# Patient Record
Sex: Male | Born: 1975 | Race: Black or African American | Hispanic: No | Marital: Single | Smoking: Never smoker
Health system: Southern US, Community
[De-identification: ages and names within clinical notes are randomized; demographics above are authoritative.]

---

## 2020-11-27 ENCOUNTER — Emergency Department: Payer: Self-pay

## 2020-11-27 ENCOUNTER — Other Ambulatory Visit: Payer: Self-pay

## 2020-11-27 ENCOUNTER — Emergency Department
Admission: EM | Admit: 2020-11-27 | Discharge: 2020-11-27 | Disposition: A | Discharge status: Home / Self Care | Payer: Self-pay | Attending: Emergency Medicine | Admitting: Emergency Medicine

## 2020-11-27 ENCOUNTER — Encounter: Payer: Self-pay | Admitting: Emergency Medicine

## 2020-11-27 DIAGNOSIS — R1031 Right lower quadrant pain: Secondary | ICD-10-CM | POA: Insufficient documentation

## 2020-11-27 DIAGNOSIS — R55 Syncope and collapse: Secondary | ICD-10-CM

## 2020-11-27 DIAGNOSIS — W1840XA Slipping, tripping and stumbling without falling, unspecified, initial encounter: Secondary | ICD-10-CM | POA: Insufficient documentation

## 2020-11-27 DIAGNOSIS — R609 Edema, unspecified: Secondary | ICD-10-CM

## 2020-11-27 DIAGNOSIS — N453 Epididymo-orchitis: Secondary | ICD-10-CM | POA: Insufficient documentation

## 2020-11-27 DIAGNOSIS — Z23 Encounter for immunization: Secondary | ICD-10-CM | POA: Insufficient documentation

## 2020-11-27 DIAGNOSIS — S0181XA Laceration without foreign body of other part of head, initial encounter: Secondary | ICD-10-CM | POA: Insufficient documentation

## 2020-11-27 LAB — URINALYSIS, ROUTINE W REFLEX MICROSCOPIC
Bacteria, UA: NONE SEEN
Bilirubin Urine: NEGATIVE
Glucose, UA: NEGATIVE mg/dL
Ketones, ur: NEGATIVE mg/dL
Nitrite: NEGATIVE
Protein, ur: NEGATIVE mg/dL
Specific Gravity, Urine: 1.016 (ref 1.005–1.030)
WBC, UA: >50 WBC/hpf — ABNORMAL HIGH (ref 0–5)
pH: 5 (ref 5.0–8.0)

## 2020-11-27 LAB — COMPREHENSIVE METABOLIC PANEL
ALT: 10 U/L (ref 0–44)
AST: 17 U/L (ref 15–41)
Albumin: 4.2 g/dL (ref 3.5–5.0)
Alkaline Phosphatase: 72 U/L (ref 38–126)
Anion gap: 10 (ref 5–15)
BUN: 19 mg/dL (ref 6–20)
CO2: 26 mmol/L (ref 22–32)
Calcium: 9.2 mg/dL (ref 8.9–10.3)
Chloride: 101 mmol/L (ref 98–111)
Creatinine, Ser: 1.26 mg/dL — ABNORMAL HIGH (ref 0.61–1.24)
GFR, Estimated: >60 mL/min (ref >60)
Glucose, Bld: 97 mg/dL (ref 70–99)
Potassium: 3.4 mmol/L — ABNORMAL LOW (ref 3.5–5.1)
Sodium: 137 mmol/L (ref 135–145)
Total Bilirubin: 0.6 mg/dL (ref 0.3–1.2)
Total Protein: 8.4 g/dL — ABNORMAL HIGH (ref 6.5–8.1)

## 2020-11-27 LAB — LIPASE, BLOOD: Lipase: 38 U/L (ref 11–51)

## 2020-11-27 LAB — CBC
HCT: 42 % (ref 39.0–52.0)
Hemoglobin: 13.5 g/dL (ref 13.0–17.0)
MCH: 30.2 pg (ref 26.0–34.0)
MCHC: 32.1 g/dL (ref 30.0–36.0)
MCV: 94 fL (ref 80.0–100.0)
Platelets: 324 10*3/uL (ref 150–400)
RBC: 4.47 MIL/uL (ref 4.22–5.81)
RDW: 12.2 % (ref 11.5–15.5)
WBC: 18.2 10*3/uL — ABNORMAL HIGH (ref 4.0–10.5)
nRBC: 0 % (ref 0.0–0.2)

## 2020-11-27 LAB — TROPONIN I (HIGH SENSITIVITY): Troponin I (High Sensitivity): 8 ng/L (ref <18)

## 2020-11-27 LAB — CHLAMYDIA/NGC RT PCR (ARMC ONLY)
Chlamydia Tr: NOT DETECTED
N gonorrhoeae: NOT DETECTED

## 2020-11-27 MED ORDER — OXYCODONE HCL 5 MG PO TABS: 5.0000 mg | ORAL_TABLET | Freq: Four times a day (QID) | ORAL | 0 refills | Status: AC | PRN

## 2020-11-27 MED ORDER — LIDOCAINE-EPINEPHRINE (PF) 2 %-1:200000 IJ SOLN
10.0000 mL | Freq: Once | INTRAMUSCULAR | Status: AC
  Administered 2020-11-27: 10 mL
  Filled 2020-11-27: qty 20

## 2020-11-27 MED ORDER — OXYCODONE HCL 5 MG PO TABS
5.0000 mg | ORAL_TABLET | Freq: Once | ORAL | Status: AC
  Administered 2020-11-27: 5 mg via ORAL
  Filled 2020-11-27: qty 1

## 2020-11-27 MED ORDER — SODIUM CHLORIDE 0.9 % IV SOLN
1.0000 g | Freq: Once | INTRAVENOUS | Status: AC
  Administered 2020-11-27: 1 g via INTRAVENOUS
  Filled 2020-11-27: qty 10

## 2020-11-27 MED ORDER — TETANUS-DIPHTH-ACELL PERTUSSIS 5-2.5-18.5 LF-MCG/0.5 IM SUSY
0.5000 mL | PREFILLED_SYRINGE | Freq: Once | INTRAMUSCULAR | Status: AC
  Administered 2020-11-27: 0.5 mL via INTRAMUSCULAR
  Filled 2020-11-27: qty 0.5

## 2020-11-27 MED ORDER — POTASSIUM CHLORIDE CRYS ER 20 MEQ PO TBCR: 40.0000 meq | EXTENDED_RELEASE_TABLET | Freq: Once | ORAL | Status: DC

## 2020-11-27 MED ORDER — LEVOFLOXACIN 500 MG PO TABS: 500.0000 mg | ORAL_TABLET | Freq: Every day | ORAL | 0 refills | Status: AC

## 2020-11-27 MED ORDER — SODIUM CHLORIDE 0.9 % IV BOLUS
1000.0000 mL | Freq: Once | INTRAVENOUS | Status: AC
  Administered 2020-11-27: 1000 mL via INTRAVENOUS

## 2020-11-27 MED ORDER — IOHEXOL 300 MG/ML  SOLN
100.0000 mL | Freq: Once | INTRAMUSCULAR | Status: AC | PRN
  Administered 2020-11-27: 100 mL via INTRAVENOUS
  Filled 2020-11-27: qty 100

## 2020-11-27 NOTE — ED Provider Notes (Signed)
[facility] Emergency Department Provider Note  ____________________________________________   Event Date/Time   First MD Initiated Contact with Patient 11/27/20 1531     (approximate)  I have reviewed the triage vital signs and the nursing notes.   HISTORY  Chief Complaint Laceration and Abdominal Pain    HPI Theodore Mcgee is a 45 y.o. male otherwise healthy who comes in with right lower quadrant pain.  Patient reports not feeling well since Wednesday, 3 days.  He states that he initially had pain in his right lower quadrant, constant but now started to radiate into his right testicle.  Nothing makes it better, nothing makes it worse.  He does report a little bit of swelling in his testicle as well.  Reports was having sex a month ago when its been with the same partner.  Denies any discharge.  He states that symptoms he has some increased urinary frequency.  Also reports that is darker in color than normal.  States that he went to the bathroom last night and he thinks he passed out and woke up with blood above his left eye.  Patient denies any neck pain or difficulties moving his neck.           History reviewed. No pertinent past medical history.  There are no problems to display for this patient.   History reviewed. No pertinent surgical history.  Prior to Admission medications   Not on File    Allergies Patient has no known allergies.  History reviewed. No pertinent family history.  Social History Social History   Tobacco Use   Smoking status: Never   Smokeless tobacco: Never  Substance Use Topics   Alcohol use: Yes   Drug use: Not Currently      Review of Systems Constitutional: No fever/chills, LOC Eyes: No visual changes. ENT: No sore throat. Cardiovascular: Denies chest pain. Respiratory: Denies shortness of breath. Gastrointestinal: Abdominal pain no nausea, no vomiting.  No diarrhea.  No constipation. Genitourinary:  Increased frequency, right testicle pain Musculoskeletal: Negative for back pain. Skin: Negative for rash. Neurological: Negative for headaches, focal weakness or numbness. All other ROS negative ____________________________________________   PHYSICAL EXAM:  VITAL SIGNS: ED Triage Vitals [11/27/20 1451]  Enc Vitals Group     BP 121/82     Pulse Rate 83     Resp 17     Temp 98.1 F (36.7 C)     Temp Source Oral     SpO2 100 %     Weight 150 lb (68 kg)     Height 6' (1.829 m)     Head Circumference      Peak Flow      Pain Score 7     Pain Loc      Pain Edu?      Excl. in GC?     Constitutional: Alert and oriented. Well appearing and in no acute distress. Eyes: Conjunctivae are normal. EOMI. pupils are reactive Head: Small laceration above the left eyebrow. Nose: Tenderness along the nose. Mouth/Throat: Mucous membranes are moist.   Neck: No stridor. Trachea Midline. FROM Cardiovascular: Normal rate, regular rhythm. Grossly normal heart sounds.  Good peripheral circulation. Respiratory: Normal respiratory effort.  No retractions. Lungs CTAB. Gastrointestinal: Tenderness in the right lower quadrant no distention. No abdominal bruits.  Musculoskeletal: No lower extremity tenderness nor edema.  No joint effusions. Neurologic:  Normal speech and language. No gross focal neurologic deficits are appreciated.  Skin:  Skin is  warm, dry and intact. No rash noted. Psychiatric: Mood and affect are normal. Speech and behavior are normal. GU: Testicle swelling noted on the right with some tenderness.  No obvious skin changes  ____________________________________________   LABS (all labs ordered are listed, but only abnormal results are displayed)  Labs Reviewed  COMPREHENSIVE METABOLIC PANEL - Abnormal; Notable for the following components:      Result Value   Potassium 3.4 (*)    Creatinine, Ser 1.26 (*)    Total Protein 8.4 (*)    All other components within normal limits   CBC - Abnormal; Notable for the following components:   WBC 18.2 (*)    All other components within normal limits  CHLAMYDIA/NGC RT PCR ([facility] ONLY)            LIPASE, BLOOD  URINALYSIS, ROUTINE W REFLEX MICROSCOPIC  TROPONIN I (HIGH SENSITIVITY)   ____________________________________________   ED ECG REPORT I, Concha Se, the attending physician, personally viewed and interpreted this ECG.  Normal sinus rate of 80, no ST elevation, T wave inversion in lead III, normal intervals ____________________________________________  RADIOLOGY   Official radiology report(s): CT HEAD WO CONTRAST ( )  Result Date: 11/27/2020 CLINICAL DATA:  Facial trauma EXAM: CT HEAD WITHOUT CONTRAST CT MAXILLOFACIAL WITHOUT CONTRAST TECHNIQUE: Multidetector CT imaging of the head and maxillofacial structures were performed using the standard protocol without intravenous contrast. Multiplanar CT image reconstructions of the maxillofacial structures were also generated. COMPARISON:  None. FINDINGS: CT HEAD FINDINGS Brain: No evidence of acute infarction, hemorrhage, hydrocephalus, extra-axial collection or mass lesion/mass effect. Vascular: No hyperdense vessel identified. Skull: No acute fracture. Other: No mastoid effusions. CT MAXILLOFACIAL FINDINGS Osseous: No fracture or mandibular dislocation. No destructive process. Dental disease including periapical lucencies of multiple maxillary and mandibular teeth. Orbits: Negative. No traumatic or inflammatory finding. Sinuses: Retention cyst within the left sphenoid sinus and left maxillary sinus. Otherwise, clear sinuses without air-fluid level. Soft tissues: Negative. IMPRESSION: 1. No evidence of acute intracranial abnormality. 2. No evidence of acute facial fracture. 3. Dental disease including periapical lucencies of multiple maxillary and mandibular teeth. Electronically Signed   By: Feliberto Harts M.D.   On: 11/27/2020 17:24   CT ABDOMEN PELVIS W  CONTRAST  Result Date: 11/27/2020 CLINICAL DATA:  Right lower quadrant pain, abdominal distension EXAM: CT ABDOMEN AND PELVIS WITH CONTRAST TECHNIQUE: Multidetector CT imaging of the abdomen and pelvis was performed using the standard protocol following bolus administration of intravenous contrast. CONTRAST:  OMNIPAQUE IOHEXOL 300 MG/ML  SOLN COMPARISON:  11/27/2020 FINDINGS: Lower chest: No acute pleural or parenchymal lung disease. Hepatobiliary: No focal liver abnormality is seen. No gallstones, gallbladder wall thickening, or biliary dilatation. Pancreas: Unremarkable. No pancreatic ductal dilatation or surrounding inflammatory changes. Spleen: Normal in size without focal abnormality. Adrenals/Urinary Tract: Adrenal glands are unremarkable. Kidneys are normal, without renal calculi, focal lesion, or hydronephrosis. Bladder is unremarkable. Stomach/Bowel: No bowel obstruction or ileus. Normal retrocecal appendix. No bowel wall thickening or inflammatory change. Vascular/Lymphatic: Aortic atherosclerosis. No enlarged abdominal or pelvic lymph nodes. Reproductive: Hyperenhancing right testicle and epididymis consistent with the epididymal orchitis seen on preceding ultrasound. Large right hydrocele again partially visualized. Left hemiscrotum appears unremarkable. Prostate is unremarkable. Other: No free fluid or free gas.  No abdominal wall hernia. Musculoskeletal: No acute or destructive bony lesions. Reconstructed images demonstrate no additional findings. IMPRESSION: 1. Hyperenhancing right epididymis and right testicle, consistent with the epididymo-orchitis seen on preceding ultrasound. The right-sided hydrocele seen on preceding  ultrasound is partially visualized on this exam. 2. Otherwise no acute intra-abdominal or intrapelvic process. Normal appendix. 3.  Aortic Atherosclerosis (ICD10-I70.0). Electronically Signed   By: Sharlet Salina M.D.   On: 11/27/2020 17:25   US SCROTUM  W/DOPPLER  Result Date: 11/27/2020 CLINICAL DATA:  Testicular swelling x3 days. EXAM: SCROTAL ULTRASOUND DOPPLER ULTRASOUND OF THE TESTICLES TECHNIQUE: Complete ultrasound examination of the testicles, epididymis, and other scrotal structures was performed. Color and spectral Doppler ultrasound were also utilized to evaluate blood flow to the testicles. COMPARISON:  None. FINDINGS: Right testicle Measurements: 3.9 cm x 2.6 cm x 2.7 cm. Microlithiasis is noted without evidence of a testicular mass. Diffusely increased flow is noted on color Doppler evaluation. Left testicle Measurements: 4.0 cm x 2.1 cm x 2.6 cm. No mass or microlithiasis visualized. Right epididymis: The right epididymis measures 1.5 cm x 1.0 cm x 1.9 cm and demonstrates increased flow on color Doppler evaluation. Left epididymis: The left epididymis measures 1.5 cm x 0.7 cm x 1.3 cm. Hydrocele:  Bilateral hydroceles are seen, right greater than left. Varicocele:  A septated right-sided varicocele is noted. Pulsed Doppler interrogation of both testes demonstrates normal low resistance arterial and venous waveforms bilaterally. IMPRESSION: 1. Findings consistent with right-sided epididymo-orchitis. 2. Septated right-sided hydrocele. Electronically Signed   By: Aram Candela M.D.   On: 11/27/2020 17:13   CT Maxillofacial Wo Contrast  Result Date: 11/27/2020 CLINICAL DATA:  Facial trauma EXAM: CT HEAD WITHOUT CONTRAST CT MAXILLOFACIAL WITHOUT CONTRAST TECHNIQUE: Multidetector CT imaging of the head and maxillofacial structures were performed using the standard protocol without intravenous contrast. Multiplanar CT image reconstructions of the maxillofacial structures were also generated. COMPARISON:  None. FINDINGS: CT HEAD FINDINGS Brain: No evidence of acute infarction, hemorrhage, hydrocephalus, extra-axial collection or mass lesion/mass effect. Vascular: No hyperdense vessel identified. Skull: No acute fracture. Other: No mastoid  effusions. CT MAXILLOFACIAL FINDINGS Osseous: No fracture or mandibular dislocation. No destructive process. Dental disease including periapical lucencies of multiple maxillary and mandibular teeth. Orbits: Negative. No traumatic or inflammatory finding. Sinuses: Retention cyst within the left sphenoid sinus and left maxillary sinus. Otherwise, clear sinuses without air-fluid level. Soft tissues: Negative. IMPRESSION: 1. No evidence of acute intracranial abnormality. 2. No evidence of acute facial fracture. 3. Dental disease including periapical lucencies of multiple maxillary and mandibular teeth. Electronically Signed   By: Feliberto Harts M.D.   On: 11/27/2020 17:24    ____________________________________________   PROCEDURES  Procedure(s) performed (including Critical Care):  Procedures   ____________________________________________   INITIAL IMPRESSION / ASSESSMENT AND PLAN / ED COURSE  Theodore Mcgee was evaluated in Emergency Department on 11/27/2020 for the symptoms described in the history of present illness. He was evaluated in the context of the global COVID-19 pandemic, which necessitated consideration that the patient might be at risk for infection with the SARS-CoV-2 virus that causes COVID-19. Institutional protocols and algorithms that pertain to the evaluation of patients at risk for COVID-19 are in a state of rapid change based on information released by regulatory bodies including the CDC and federal and state organizations. These policies and algorithms were followed during the patient's care in the ED.    Patient comes in with abdominal pain, syncope episode possibly secondary to the pain.  Labs ordered to evaluate for any electro abnormalities, AKI.  Patient does have slight bump in his creatinine and she will give some IV fluids.  Will get CT scan to evaluate for appendicitis, abscess, Fournier's gangrene as well as  ultrasound to assess Doppler flow.  We will get urine,  gonorrhea and Chlamydia testing.  For possible syncopal episode will get EKG   CT imaging is negative Some dental disease the patient can follow-up outpatient for Ultrasound however concerning for right-sided epididymoorchitis.  He denies any new sexual partners.  He reports that he has been with 1 person and last had sex a month ago.  He states maybe she cheated on him.  I explained the patient does not mean that this is from an STD but wishes to get testing and treat him as if it could be from an STD or could also be from a UTI or E. coli.  We will give him a dose of ceftriaxone and start him on some levofloxacin.  We discussed Tylenol ibuprofen for pain and using oxycodone for breakthrough pain.  Do not drive or work while on this.  Patient will need sutures removed in 3 to 5 days.  This was repaired by PA.  Please see their note.  G/C are still pending.  For the syncopal episode his EKG is without evidence of arrhythmia and his troponin is negative so unlikely related to a cardiac cause.  More likely secondary to pain/vasovagal.  I discussed the provisional nature of ED diagnosis, the treatment so far, the ongoing plan of care, follow up appointments and return precautions with the patient and any family or support people present. They expressed understanding and agreed with the plan, discharged home.         ____________________________________________   FINAL CLINICAL IMPRESSION(S) / ED DIAGNOSES   Final diagnoses:  Swelling  Orchitis and epididymitis  Facial laceration, initial encounter  Syncope, unspecified syncope type      MEDICATIONS GIVEN DURING THIS VISIT:  Medications  oxyCODONE (Oxy IR/ROXICODONE) immediate release tablet 5 mg (5 mg Oral Given 11/27/20 1557)  sodium chloride 0.9 % bolus 1,000 mL (1,000 mLs Intravenous New Bag/Given 11/27/20 1612)  Tdap (BOOSTRIX) injection 0.5 mL (0.5 mLs Intramuscular Given 11/27/20 1608)  lidocaine-EPINEPHrine (XYLOCAINE W/EPI) 2  %-1:200000 (PF) injection 10 mL (10 mLs Infiltration Given 11/27/20 1710)  iohexol (OMNIPAQUE) 300 MG/ML solution 100 mL (100 mLs Intravenous Contrast Given 11/27/20 1703)  cefTRIAXone (ROCEPHIN) 1 g in sodium chloride 0.9 % 100 mL IVPB (1 g Intravenous New Bag/Given 11/27/20 1749)     ED Discharge Orders          Ordered    levofloxacin (LEVAQUIN) 500 MG tablet  Daily        11/27/20 1754    oxyCODONE (ROXICODONE) 5 MG immediate release tablet  Every 6 hours PRN        11/27/20 1754             Note:  This document was prepared using Dragon voice recognition software and may include unintentional dictation errors.    Concha Se, MD 11/27/20 [phone]

## 2020-11-27 NOTE — ED Triage Notes (Signed)
Pt comes into the ED via POV c/o abd pain that has been intermittent x 3 days.  Pt states it was in the middle of the night when he was rushing to the bathroom for the abd pain, when he lost his footing, slipped and hit his head in the bathroom.  Pt now has small laceration over the left eye.  Pt denies any N/V/D.  Pt explains the stomach pain starts in the right lower side of the abdomen and the pain is shooting pain into the testicle.  Pt denies any known bulge.  Pt denies any problems with urination but he states it is darker than normal.  Pt in NAD at this time with even and unlabored respirations.

## 2020-11-27 NOTE — ED Provider Notes (Signed)
..  Laceration Repair  Date/Time: 11/27/2020 4:40 PM Performed by: Racheal Patches, PA-C Authorized by: Racheal Patches, PA-C   Consent:    Consent obtained:  Verbal   Consent given by:  Patient   Risks discussed:  Infection and pain Universal protocol:    Procedure explained and questions answered to patient or proxy's satisfaction: yes     Immediately prior to procedure, a time out was called: yes     Patient identity confirmed:  Verbally with patient Anesthesia:    Anesthesia method:  Local infiltration   Local anesthetic:  Lidocaine 1% WITH epi Laceration details:    Location:  Face   Face location:  L eyebrow   Length (cm):  1.5 Pre-procedure details:    Preparation:  Patient was prepped and draped in usual sterile fashion Exploration:    Hemostasis achieved with:  Direct pressure   Imaging outcome: foreign body not noted     Wound exploration: entire depth of wound visualized     Wound extent: no foreign bodies/material noted, no nerve damage noted and no underlying fracture noted     Contaminated: no   Treatment:    Area cleansed with:  Saline and povidone-iodine   Amount of cleaning:  Standard   Irrigation solution:  Sterile saline   Irrigation volume:    Irrigation method:  Syringe   Debridement:  None Skin repair:    Repair method:  Sutures   Suture size:  5-0   Suture material:  Nylon   Suture technique:  Simple interrupted   Number of sutures:  3 Approximation:    Approximation:  Close Repair type:    Repair type:  Simple Post-procedure details:    Dressing:  Open (no dressing)   Procedure completion:  Tolerated well, no immediate complications    Racheal Patches, PA-C 11/27/20 1825    Concha Se, MD 11/27/20 1842

## 2020-11-27 NOTE — Discharge Instructions (Addendum)
Take Tylenol 1 g every 8 hours and ibuprofen 400 every 8 hours with food.  Use the oxycodone for breakthrough pain.  Do not drive or work while on this.  Take the antibiotics to help with an infection.  Follow-up in MyChart for your STD testing  IMPRESSION:  1. Findings consistent with right-sided epididymo-orchitis.  2. Septated right-sided hydrocele.     3. Dental disease including periapical lucencies of multiple maxillary and mandibular teeth.  Take oxycodone as prescribed. Do not drink alcohol, drive or participate in any other potentially dangerous activities while taking this medication as it may make you sleepy. Do not take this medication with any other sedating medications, either prescription or over-the-counter. If you were prescribed Percocet or Vicodin, do not take these with acetaminophen (Tylenol) as it is already contained within these medications.  This medication is an opiate (or narcotic) pain medication and can be habit forming. Use it as little as possible to achieve adequate pain control. Do not use or use it with extreme caution if you have a history of opiate abuse or dependence. If you are on a pain contract with your primary care doctor or a pain specialist, be sure to let them know you were prescribed this medication today from the Emergency Department. This medication is intended for your use only - do not give any to anyone else and keep it in a secure place where nobody else, especially children, have access to it.

## 2020-11-29 LAB — URINE CULTURE: Culture: <10000 — AB

## 2022-11-02 IMAGING — CT CT MAXILLOFACIAL W/O CM
4 of 9 series · 16 of 47 positions shown, 19 images · non-contrast
Comparison: None.

CLINICAL DATA: Facial trauma

EXAM:
CT HEAD WITHOUT CONTRAST
CT MAXILLOFACIAL WITHOUT CONTRAST
TECHNIQUE: Multidetector CT imaging of the head and maxillofacial structures
were performed using the standard protocol without intravenous
contrast. Multiplanar CT image reconstructions of the maxillofacial
structures were also generated.

[Series 3: head bone · axial · 0.43mm/px · z∈[+267,+409]mm · 10 of 87 slices shown, 13 images]
[im 8/87  brain]
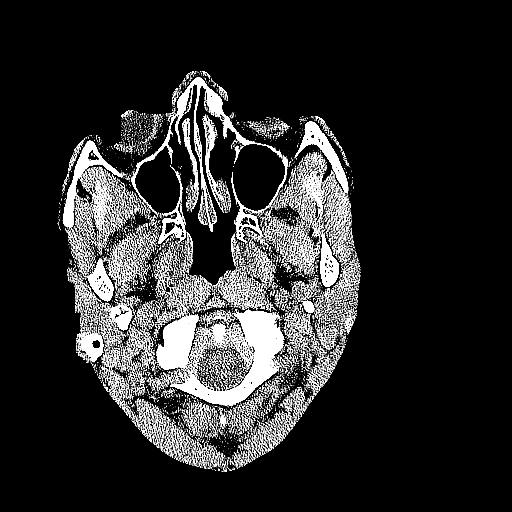
[im 8/87  bone]
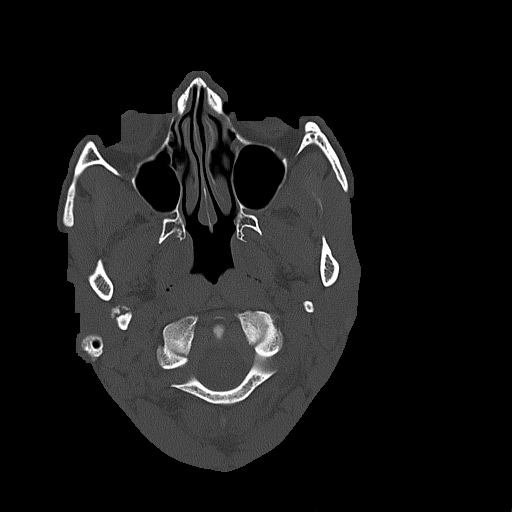
[im 16/87  bone]
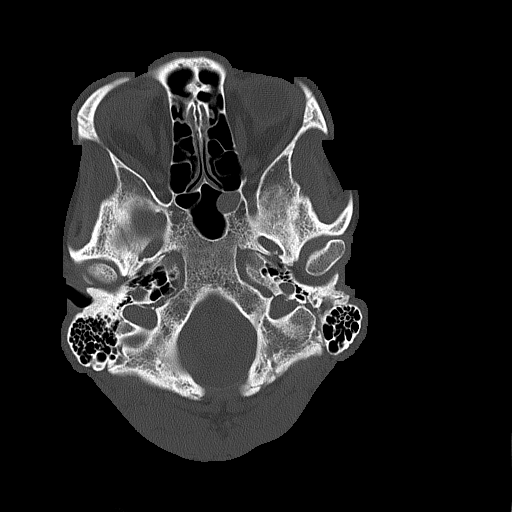
[im 24/87  bone]
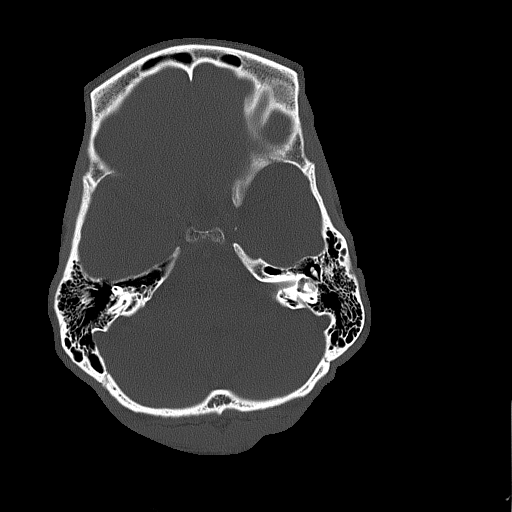
[im 32/87  bone]
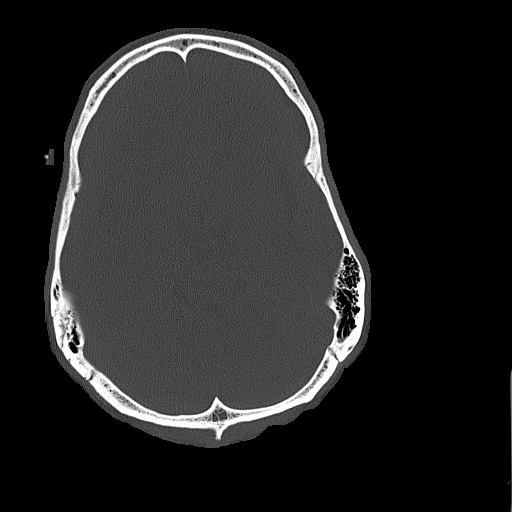
[im 40/87  brain]
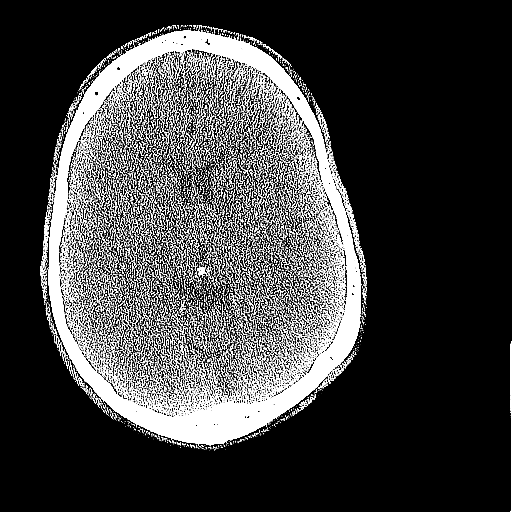
[im 40/87  bone]
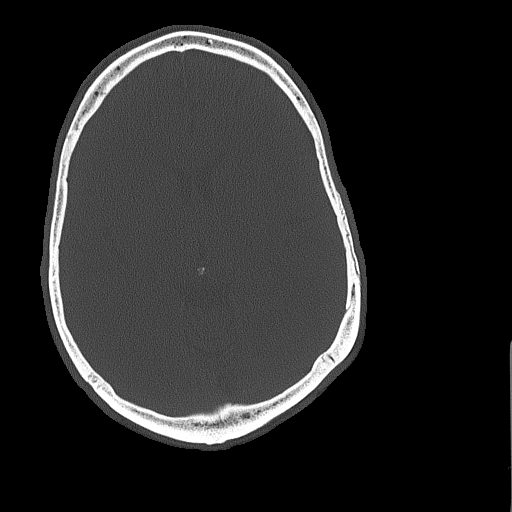
[im 47/87  bone]
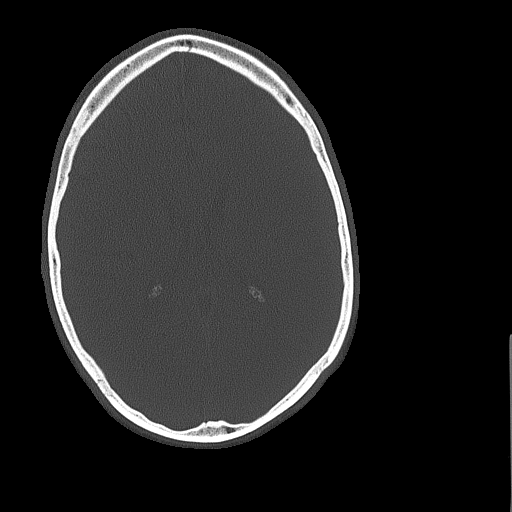
[im 55/87  bone]
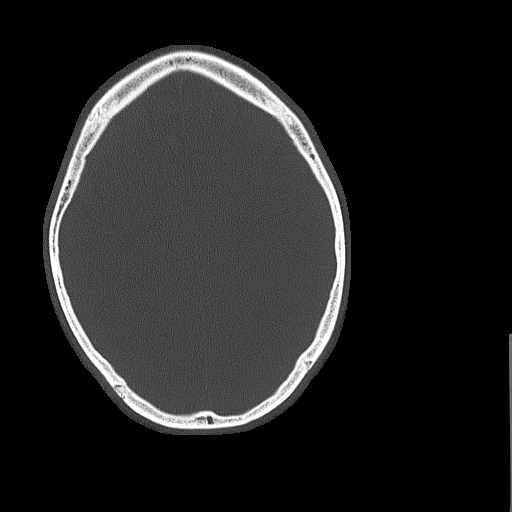
[im 63/87  bone]
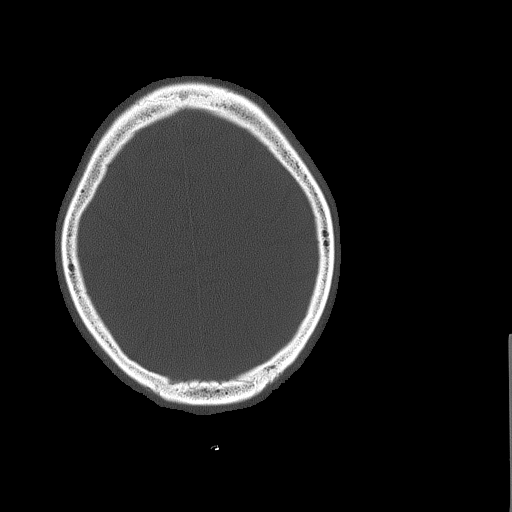
[im 71/87  brain]
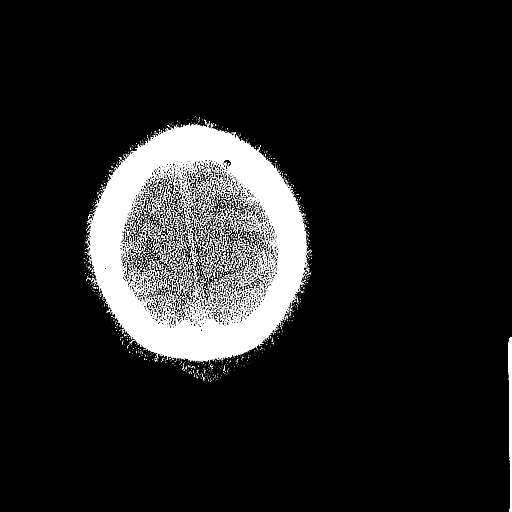
[im 71/87  bone]
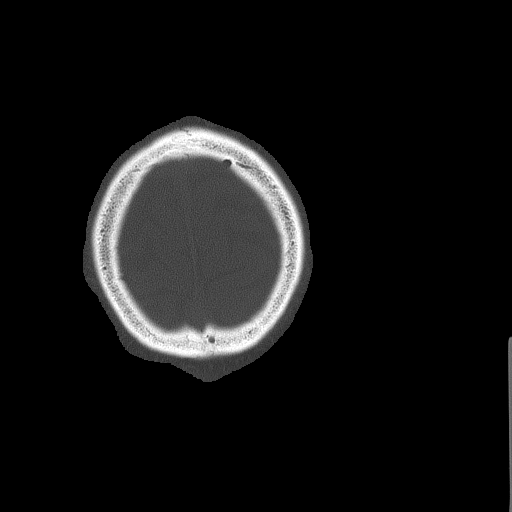
[im 79/87  bone]
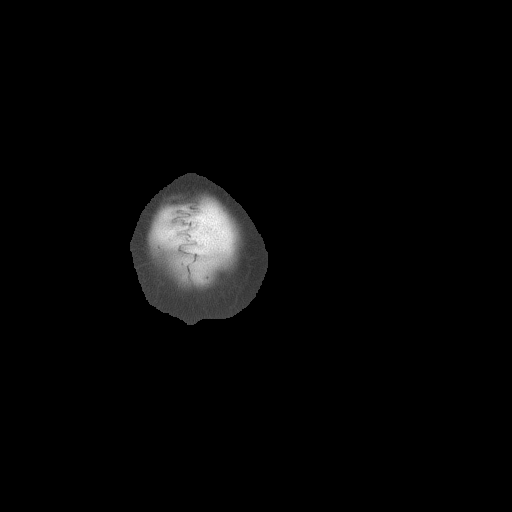

[Series 6: max soft · axial · 0.42mm/px · z∈[+170,+232]mm · 4 of 94 slices shown]
[im 8/94  brain]
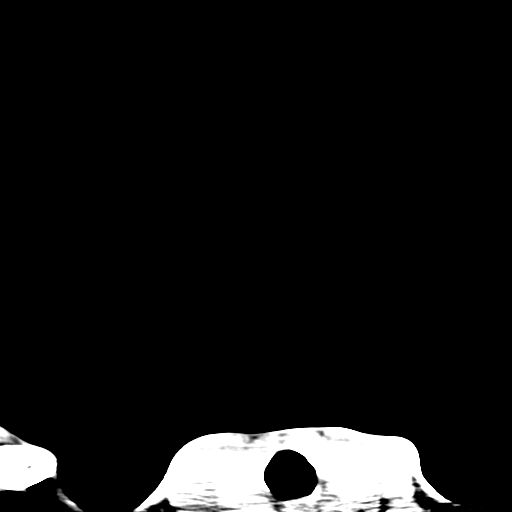
[im 24/94  brain]
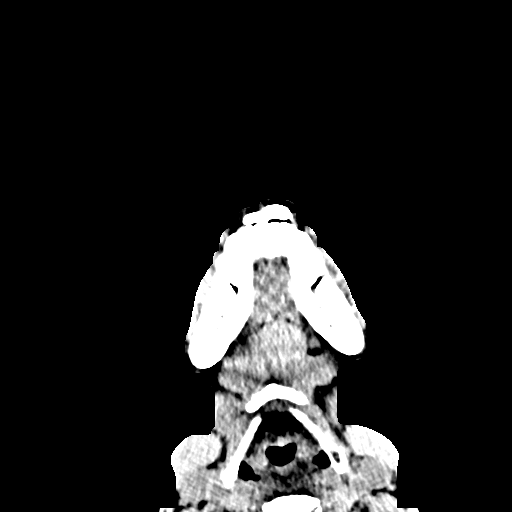
[im 32/94  brain]
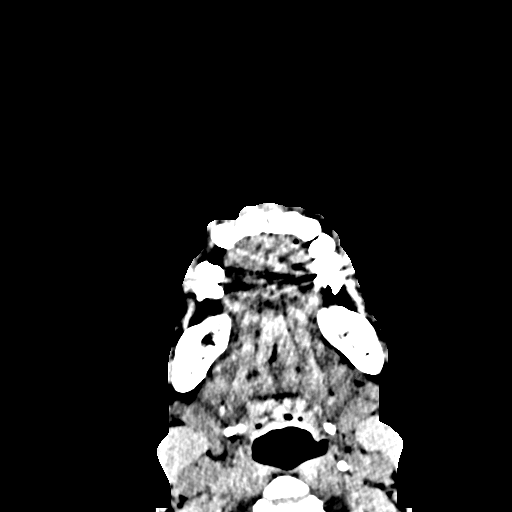
[im 39/94  brain]
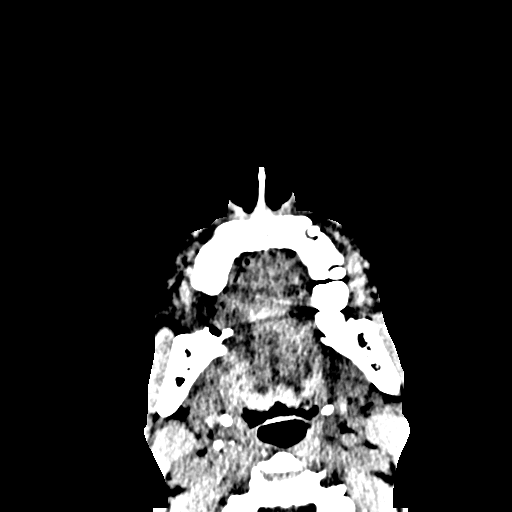

[Series 12: coronal bone · coronal · 0.38mm/px · 1 of 86 slices shown]
[im 43/86  bone]
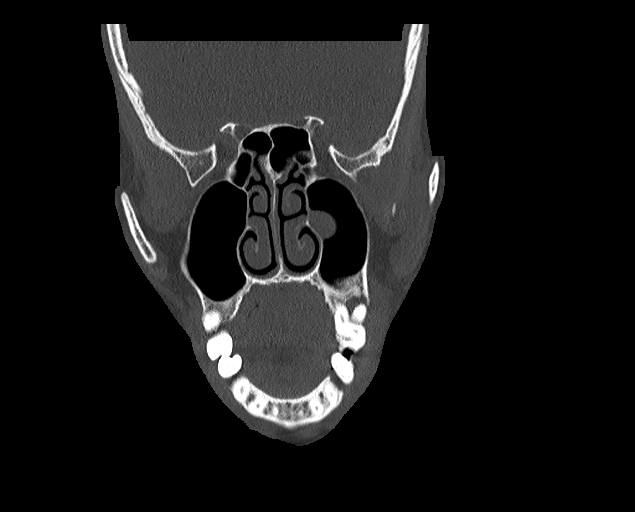

[Series 13: sagittal bone · sagittal · 0.32mm/px · 1 of 79 slices shown]
[im 40/79  bone]
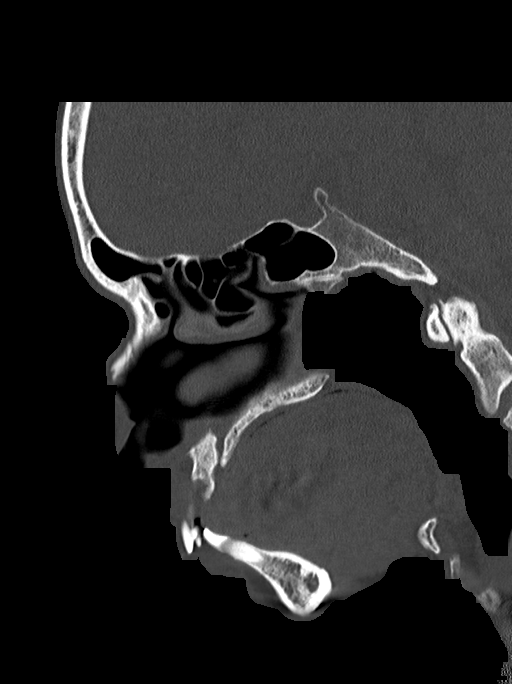

[16 of 47 positions shown; findings below may reference images not displayed]

FINDINGS: CT HEAD FINDINGS

Brain: No evidence of acute infarction, hemorrhage, hydrocephalus,
extra-axial collection or mass lesion/mass effect.

Vascular: No hyperdense vessel identified.

Skull: No acute fracture.

Other: No mastoid effusions.

CT MAXILLOFACIAL FINDINGS

Osseous: No fracture or mandibular dislocation. No destructive
process. Dental disease including periapical lucencies of multiple
maxillary and mandibular teeth.

Orbits: Negative. No traumatic or inflammatory finding.

Sinuses: Retention cyst within the left sphenoid sinus and left
maxillary sinus. Otherwise, clear sinuses without air-fluid level.

Soft tissues: Negative.
IMPRESSION: 1. No evidence of acute intracranial abnormality.
2. No evidence of acute facial fracture.
3. Dental disease including periapical lucencies of multiple
maxillary and mandibular teeth.

## 2023-02-01 ENCOUNTER — Encounter (HOSPITAL_COMMUNITY): Payer: Self-pay

## 2023-02-01 ENCOUNTER — Ambulatory Visit (HOSPITAL_COMMUNITY)
Admission: EM | Admit: 2023-02-01 | Discharge: 2023-02-01 | Disposition: A | Discharge status: Home / Self Care | Payer: 59 | Attending: Emergency Medicine | Admitting: Emergency Medicine

## 2023-02-01 DIAGNOSIS — J101 Influenza due to other identified influenza virus with other respiratory manifestations: Secondary | ICD-10-CM

## 2023-02-01 LAB — POC COVID19/FLU A&B COMBO
Covid Antigen, POC: NEGATIVE
Influenza A Antigen, POC: POSITIVE — AB
Influenza B Antigen, POC: NEGATIVE

## 2023-02-01 MED ORDER — ACETAMINOPHEN 325 MG PO TABS
ORAL_TABLET | ORAL | Status: AC
  Filled 2023-02-01: qty 2

## 2023-02-01 MED ORDER — ACETAMINOPHEN 325 MG PO TABS
650.0000 mg | ORAL_TABLET | Freq: Once | ORAL | Status: AC
  Administered 2023-02-01: 650 mg via ORAL

## 2023-02-01 MED ORDER — OSELTAMIVIR PHOSPHATE 75 MG PO CAPS: 75.0000 mg | ORAL_CAPSULE | Freq: Two times a day (BID) | ORAL | 0 refills | Status: DC

## 2023-02-01 NOTE — Discharge Instructions (Signed)
Start taking Tamiflu twice daily for 5 days.  Otherwise you can alternate between Tylenol and ibuprofen as needed for body aches, headache, and fever.  I recommend Mucinex for cough and congestion.  Return here if symptoms persist.  If you develop severe shortness of breath, chest pain, excessive vomiting, or severe abdominal pain please seek immediate medical treatment in the ER.

## 2023-02-01 NOTE — ED Triage Notes (Signed)
Fever,headache,body ache x 2 days. Patient has taken Nyquil.

## 2023-02-01 NOTE — ED Provider Notes (Signed)
[facility]    CSN: [id number] Arrival date & time: 02/01/23  1745      History   Chief Complaint Chief Complaint  Patient presents with   Headache   Fever   Generalized Body Aches    HPI Theodore Mcgee is a 48 y.o. male.   Patient presents with fever, body aches, headache, cough, and congestion x 2 days.  Patient also endorses vomiting and diarrhea yesterday, denies any today.  Denies abdominal pain, shortness of breath, and chest pain.  Patient reports taking NyQuil with some relief.    Headache Associated symptoms: congestion, cough, diarrhea, fatigue, fever, nausea and vomiting   Associated symptoms: no abdominal pain, no dizziness and no weakness   Fever Associated symptoms: chills, congestion, cough, diarrhea, headaches, nausea, rhinorrhea and vomiting   Associated symptoms: no chest pain     History reviewed. No pertinent past medical history.  There are no active problems to display for this patient.   History reviewed. No pertinent surgical history.     Home Medications    Prior to Admission medications   Medication Sig Start Date End Date Taking? Authorizing Provider  oseltamivir (TAMIFLU) 75 MG capsule Take 1 capsule (75 mg total) by mouth every 12 (twelve) hours. 02/01/23  Yes Letta Kocher, NP    Family History History reviewed. No pertinent family history.  Social History Social History   Tobacco Use   Smoking status: Never   Smokeless tobacco: Never  Substance Use Topics   Alcohol use: Yes   Drug use: Not Currently     Allergies   Patient has no known allergies.   Review of Systems Review of Systems  Constitutional:  Positive for chills, fatigue and fever.  HENT:  Positive for congestion and rhinorrhea.   Respiratory:  Positive for cough. Negative for chest tightness and shortness of breath.   Cardiovascular:  Negative for chest pain.  Gastrointestinal:  Positive for diarrhea, nausea and vomiting. Negative for  abdominal pain.  Neurological:  Positive for headaches. Negative for dizziness and weakness.     Physical Exam Triage Vital Signs ED Triage Vitals [02/01/23 1849]  Encounter Vitals Group     BP 135/83     Systolic BP Percentile      Diastolic BP Percentile      Pulse Rate 83     Resp 16     Temp 100.3 F (37.9 C)     Temp Source Oral     SpO2 95 %     Weight      Height      Head Circumference      Peak Flow      Pain Score      Pain Loc      Pain Education      Exclude from Growth Chart    No data found.  Updated Vital Signs BP 135/83 (BP Location: Left Arm)   Pulse 83   Temp 100.3 F (37.9 C) (Oral)   Resp 16   SpO2 95%   Visual Acuity Right Eye Distance:   Left Eye Distance:   Bilateral Distance:    Right Eye Near:   Left Eye Near:    Bilateral Near:     Physical Exam Vitals and nursing note reviewed.  Constitutional:      General: He is awake. He is not in acute distress.    Appearance: Normal appearance. He is well-developed and well-groomed. He is not ill-appearing.  HENT:  Right Ear: Tympanic membrane, ear canal and external ear normal.     Left Ear: Tympanic membrane, ear canal and external ear normal.     Nose: Congestion and rhinorrhea present.     Mouth/Throat:     Mouth: Mucous membranes are moist.     Pharynx: Posterior oropharyngeal erythema present. No oropharyngeal exudate.  Cardiovascular:     Rate and Rhythm: Normal rate and regular rhythm.  Pulmonary:     Effort: Pulmonary effort is normal.     Breath sounds: Normal breath sounds.  Abdominal:     General: Abdomen is flat.     Palpations: Abdomen is soft.     Tenderness: There is no abdominal tenderness.  Skin:    General: Skin is warm and dry.  Neurological:     Mental Status: He is alert.  Psychiatric:        Behavior: Behavior is cooperative.      UC Treatments / Results  Labs (all labs ordered are listed, but only abnormal results are displayed) Labs Reviewed   POC COVID19/FLU A&B COMBO - Abnormal; Notable for the following components:      Result Value   Influenza A Antigen, POC Positive (*)    All other components within normal limits    EKG   Radiology No results found.  Procedures Procedures (including critical care time)  Medications Ordered in UC Medications  acetaminophen (TYLENOL) tablet 650 mg (650 mg Oral Given 02/01/23 1855)    Initial Impression / Assessment and Plan / UC Course  I have reviewed the triage vital signs and the nursing notes.  Pertinent labs & imaging results that were available during my care of the patient were reviewed by me and considered in my medical decision making (see chart for details).     Patient presented with 2-day history of fever, body aches, headache, cough, and congestion.  Patient also endorses vomiting and diarrhea yesterday, denies any today.  Upon assessment congestion and rhinorrhea are present, mild erythema noted to pharynx.  Lungs clear bilaterally on auscultation.  Tested positive for flu A in clinic.  Given Tylenol in clinic for fever.  Prescribed Tamiflu for influenza A.  Recommended over-the-counter medications.  Discussed return and strict ER precautions. Final Clinical Impressions(s) / UC Diagnoses   Final diagnoses:  Influenza A     Discharge Instructions      Start taking Tamiflu twice daily for 5 days.  Otherwise you can alternate between Tylenol and ibuprofen as needed for body aches, headache, and fever.  I recommend Mucinex for cough and congestion.  Return here if symptoms persist.  If you develop severe shortness of breath, chest pain, excessive vomiting, or severe abdominal pain please seek immediate medical treatment in the ER.    ED Prescriptions     Medication Sig Dispense Auth. Provider   oseltamivir (TAMIFLU) 75 MG capsule Take 1 capsule (75 mg total) by mouth every 12 (twelve) hours. 10 capsule Wynonia Lawman A, NP      PDMP not reviewed this  encounter.   Wynonia Lawman A, NP 02/01/23 [phone]

## 2023-10-11 ENCOUNTER — Other Ambulatory Visit: Payer: Self-pay

## 2023-10-11 ENCOUNTER — Encounter (HOSPITAL_COMMUNITY): Payer: Self-pay | Admitting: Emergency Medicine

## 2023-10-11 ENCOUNTER — Ambulatory Visit (HOSPITAL_COMMUNITY)
Admission: EM | Admit: 2023-10-11 | Discharge: 2023-10-11 | Disposition: A | Discharge status: Home / Self Care | Attending: Family Medicine | Admitting: Family Medicine

## 2023-10-11 DIAGNOSIS — M62830 Muscle spasm of back: Secondary | ICD-10-CM | POA: Diagnosis not present

## 2023-10-11 MED ORDER — CYCLOBENZAPRINE HCL 10 MG PO TABS: ORAL_TABLET | ORAL | 0 refills | Status: AC

## 2023-10-11 MED ORDER — IBUPROFEN 800 MG PO TABS: 800.0000 mg | ORAL_TABLET | Freq: Three times a day (TID) | ORAL | 0 refills | Status: AC

## 2023-10-11 NOTE — Discharge Instructions (Signed)
 HOME CARE INSTRUCTIONS: For many people, back pain returns. Since low back pain is rarely dangerous, it is often a condition that people can learn to manage on their own. Please remain active. It is stressful on the back to sit or stand in one place. Do not sit, drive, or stand in one place for more than 30 minutes at a time. Take short walks on level surfaces as soon as pain allows. Try to increase the length of time you walk each day. Do not stay in bed. Resting more than 1 or 2 days can delay your recovery. Do not avoid exercise or work. Your body is made to move. It is not dangerous to be active, even though your back may hurt. Your back will likely heal faster if you return to being active before your pain is gone. Over-the-counter medicines to reduce pain and inflammation are often the most helpful.  SEEK MEDICAL CARE IF: You have pain that is not relieved with rest or medicine. You have pain that does not improve in 1 week. You have new symptoms. You are generally not feeling well.  SEEK IMMEDIATE MEDICAL CARE IF: You have pain that radiates from your back into your legs. You develop new bowel or bladder control problems. You have unusual weakness or numbness in your arms or legs. You develop nausea or vomiting. You develop abdominal pain. You feel faint.

## 2023-10-11 NOTE — ED Provider Notes (Signed)
 [facility]   [id number] 10/11/23 Arrival Time: 1609  ASSESSMENT & PLAN:  1. Muscle spasm of back    Able to ambulate here and hemodynamically stable. No indication for imaging of back at this time given no trauma and normal neurological exam..  Meds ordered this encounter  Medications   cyclobenzaprine (FLEXERIL) 10 MG tablet    Sig: Take 1 tablet by mouth 3 times daily as needed for muscle spasm. Warning: May cause drowsiness.    Dispense:  21 tablet    Refill:  0   ibuprofen (ADVIL) 800 MG tablet    Sig: Take 1 tablet (800 mg total) by mouth 3 (three) times daily with meals.    Dispense:  21 tablet    Refill:  0   Work/school excuse note: provided. Medication sedation precautions given. Encourage ROM/movement as tolerated.  Recommend:  Follow-up Information     [facility].   Specialty: Urgent Care Why: If worsening or failing to improve as anticipated. Contact information: [address] [place]  [address] [phone]                Reviewed expectations re: course of current medical issues. Questions answered. Outlined signs and symptoms indicating need for more acute intervention. Patient verbalized understanding. After Visit Summary given.   SUBJECTIVE: History from: patient.  Theodore Mcgee is a 48 y.o. male who presents with complaint of fairly persistent right sided lower back discomfort. Onset gradual. First noted a week ago. Denies trauma but pain started when attempting to lift a table. History of back problems requiring medical care: none. Pain described as aching and without radiation. Aggravating factors: have not been identified. Alleviating factors: prolonged sitting. Progressive LE weakness or saddle anesthesia: none. Extremity sensation changes or weakness: none. Ambulatory without difficulty. Normal bowel/bladder habits: yes; without urinary retention. Normal PO intake without n/v.  No associated abdominal pain/n/v. Occas 200mg  ibuprofen without relief.  OBJECTIVE:  Vitals:   10/11/23 1719  BP: (!) 157/51  Pulse: 63  Resp: 18  Temp: 97.8 F (36.6 C)  TempSrc: Oral  SpO2: 98%    General appearance: alert; no distress HEENT: [place]; AT Neck: supple with FROM; without midline tenderness Abdomen: soft, non-tender; non-distended Back: moderate and poorly localized tenderness to palpation over R paraspinal musculature; FROM at waist; bruising: none; without midline tenderness Extremities: without edema; symmetrical without gross deformities; normal ROM of bilateral LE Skin: warm and dry Neurologic: normal gait; normal sensation and strength of bilateral LE Psychological: alert and cooperative; normal mood and affect  Labs: Results for orders placed or performed during the hospital encounter of 02/01/23  POC Covid19/Flu A&B Antigen   Collection Time: 02/01/23  6:55 PM  Result Value Ref Range   Influenza A Antigen, POC Positive (A) Negative   Influenza B Antigen, POC Negative Negative   Covid Antigen, POC Negative Negative   Labs Reviewed - No data to display  Imaging: No results found.  No Known Allergies  History reviewed. No pertinent past medical history. Social History   Socioeconomic History   Marital status: Single    Spouse name: Not on file   Number of children: Not on file   Years of education: Not on file   Highest education level: Not on file  Occupational History   Not on file  Tobacco Use   Smoking status: Every Day    Types: Cigarettes   Smokeless tobacco: Never  Vaping Use   Vaping status:  Never Used  Substance and Sexual Activity   Alcohol use: Yes   Drug use: Not Currently    Types: Marijuana   Sexual activity: Not on file  Other Topics Concern   Not on file  Social History Narrative   Not on file   Social Drivers of Health   Financial Resource Strain: Not on file  Food Insecurity: Not on file  Transportation Needs:  Not on file  Physical Activity: Not on file  Stress: Not on file  Social Connections: Not on file  Intimate Partner Violence: Not on file   History reviewed. No pertinent family history. History reviewed. No pertinent surgical history.    Rolinda Rogue, MD 10/11/23 [phone]

## 2023-10-11 NOTE — ED Triage Notes (Signed)
 Back pain started last Thursday.  Patient has had a virtual appt.  Told if pain did not improve, go to urgent care.  Patient reports pain is not as bad as it was , but does continue.  Patient has had ibuprofen.    Back pain is right lower back.  Pain occurred after lifting a piece of furniture.  Reports sharp initially, tight feeling.
